# Patient Record
Sex: Female | Born: 1935 | Race: White | Hispanic: No | Marital: Married | Smoking: Never smoker
Health system: Southern US, Community
[De-identification: ages and names within clinical notes are randomized; demographics above are authoritative.]

## PROBLEM LIST (undated history)

## (undated) DIAGNOSIS — I2510 Atherosclerotic heart disease of native coronary artery without angina pectoris: Secondary | ICD-10-CM

## (undated) DIAGNOSIS — I10 Essential (primary) hypertension: Secondary | ICD-10-CM

## (undated) DIAGNOSIS — E785 Hyperlipidemia, unspecified: Secondary | ICD-10-CM

## (undated) DIAGNOSIS — F0390 Unspecified dementia without behavioral disturbance: Secondary | ICD-10-CM

## (undated) HISTORY — DX: Atherosclerotic heart disease of native coronary artery without angina pectoris: I25.10

## (undated) HISTORY — DX: Essential (primary) hypertension: I10

## (undated) HISTORY — DX: Unspecified dementia, unspecified severity, without behavioral disturbance, psychotic disturbance, mood disturbance, and anxiety: F03.90

## (undated) HISTORY — DX: Hyperlipidemia, unspecified: E78.5

## (undated) HISTORY — PX: CORONARY ARTERY BYPASS GRAFT: SHX141

---

## 1998-08-03 DIAGNOSIS — I2510 Atherosclerotic heart disease of native coronary artery without angina pectoris: Secondary | ICD-10-CM

## 1998-08-03 HISTORY — PX: CORONARY ARTERY BYPASS GRAFT: SHX141

## 1998-08-03 HISTORY — DX: Atherosclerotic heart disease of native coronary artery without angina pectoris: I25.10

## 2001-01-15 ENCOUNTER — Encounter: Payer: Self-pay | Admitting: Emergency Medicine

## 2001-01-15 ENCOUNTER — Encounter: Payer: Self-pay | Admitting: Specialist

## 2001-01-15 ENCOUNTER — Inpatient Hospital Stay (HOSPITAL_COMMUNITY): Admission: EM | Admit: 2001-01-15 | Discharge: 2001-01-19 | Payer: Self-pay | Admitting: Emergency Medicine

## 2001-01-19 ENCOUNTER — Inpatient Hospital Stay (HOSPITAL_COMMUNITY)
Admission: RE | Admit: 2001-01-19 | Discharge: 2001-01-26 | Payer: Self-pay | Admitting: Physical Medicine & Rehabilitation

## 2002-11-07 ENCOUNTER — Ambulatory Visit (HOSPITAL_COMMUNITY): Admission: RE | Admit: 2002-11-07 | Discharge: 2002-11-07 | Payer: Self-pay | Admitting: *Deleted

## 2002-11-07 ENCOUNTER — Encounter: Payer: Self-pay | Admitting: Family Medicine

## 2006-02-23 ENCOUNTER — Ambulatory Visit (HOSPITAL_COMMUNITY): Admission: RE | Admit: 2006-02-23 | Discharge: 2006-02-24 | Payer: Self-pay | Admitting: Ophthalmology

## 2007-11-28 IMAGING — CR DG CHEST 2V
2 series · 2 of 2 positions shown · non-contrast
Comparison: none

CLINICAL DATA: 78-year-old female, preretinal fibrosis, preop for OR today.
 CHEST ? 2 VIEW:

[view not recorded (1 of 2)]
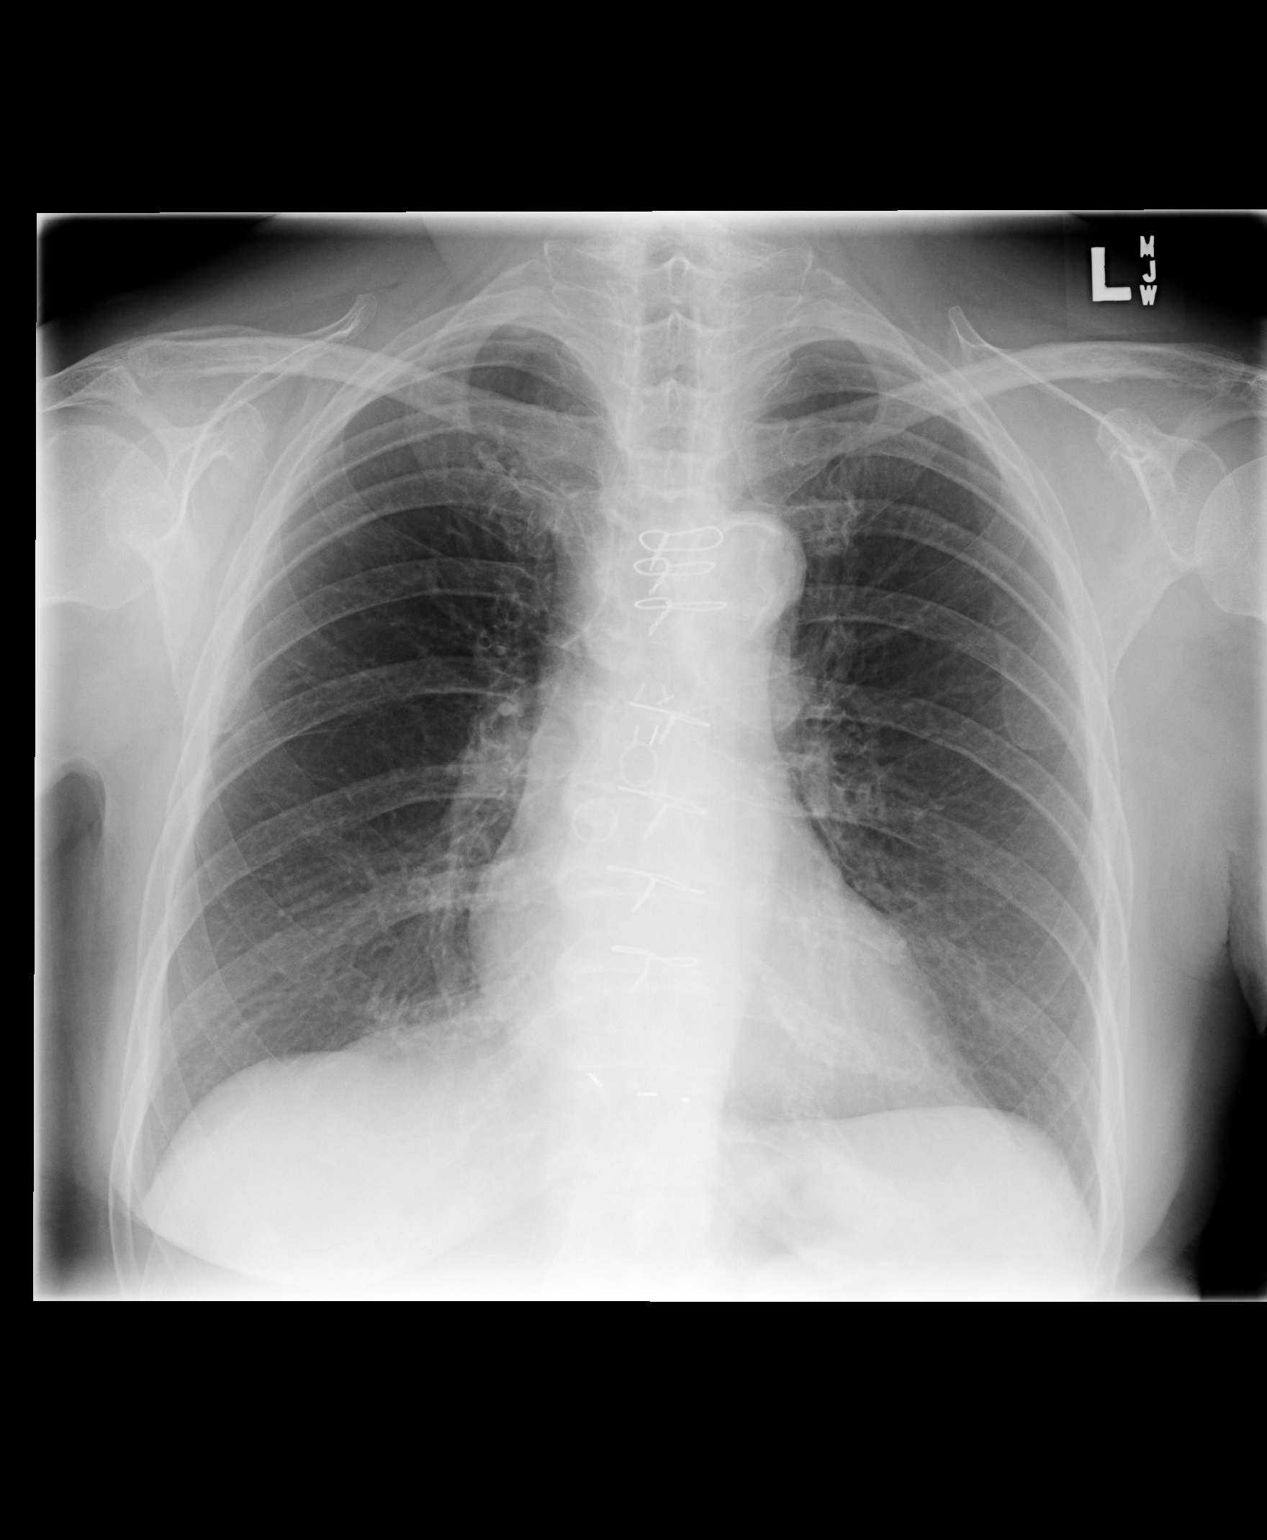

[view not recorded (2 of 2)]
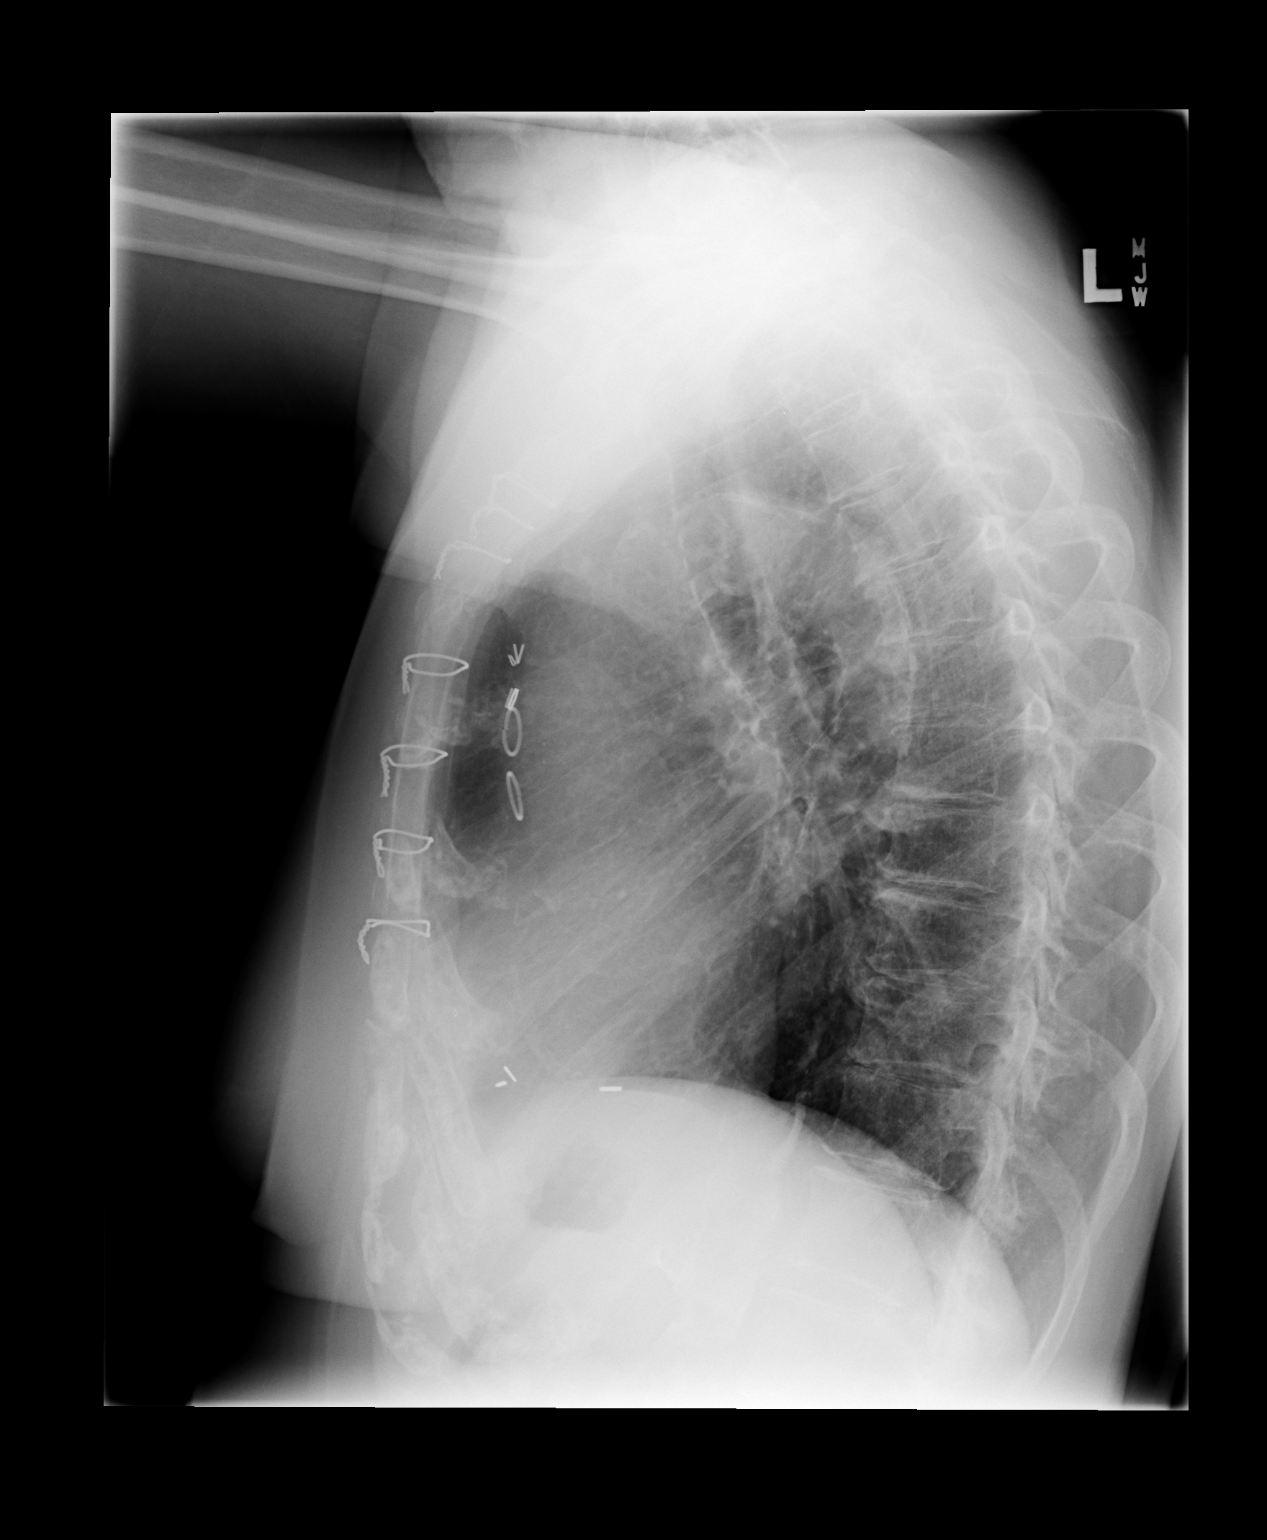

[2 of 2 positions shown; findings below may reference images not displayed]

FINDINGS: Cardiac/pericardiac silhouette is within normal limits for size.  There is some mild prominence of the interstitium, likely chronic in nature.  The patient is status post median sternotomy for CABG.  Mild degenerative changes are noted in the thoracic spine.
IMPRESSION: 1. No acute cardiopulmonary disease.
 2. Mild interstitium prominence, likely chronic in nature.
 3. Atherosclerosis.  
 4. Status post CABG.

## 2010-10-15 ENCOUNTER — Ambulatory Visit: Payer: Self-pay | Admitting: Cardiology

## 2010-11-17 ENCOUNTER — Ambulatory Visit: Payer: Self-pay | Admitting: Cardiology

## 2010-11-27 ENCOUNTER — Encounter: Payer: Self-pay | Admitting: Cardiology

## 2010-11-27 DIAGNOSIS — I2510 Atherosclerotic heart disease of native coronary artery without angina pectoris: Secondary | ICD-10-CM | POA: Insufficient documentation

## 2010-11-27 DIAGNOSIS — I10 Essential (primary) hypertension: Secondary | ICD-10-CM | POA: Insufficient documentation

## 2010-11-27 DIAGNOSIS — F0390 Unspecified dementia without behavioral disturbance: Secondary | ICD-10-CM | POA: Insufficient documentation

## 2010-11-27 DIAGNOSIS — E785 Hyperlipidemia, unspecified: Secondary | ICD-10-CM | POA: Insufficient documentation

## 2010-12-03 ENCOUNTER — Ambulatory Visit (INDEPENDENT_AMBULATORY_CARE_PROVIDER_SITE_OTHER): Payer: Medicare Other | Admitting: Cardiology

## 2010-12-03 ENCOUNTER — Encounter: Payer: Self-pay | Admitting: Cardiology

## 2010-12-03 VITALS — BP 148/62 | HR 70 | Ht 62.0 in | Wt 121.2 lb

## 2010-12-03 DIAGNOSIS — I2510 Atherosclerotic heart disease of native coronary artery without angina pectoris: Secondary | ICD-10-CM

## 2010-12-03 DIAGNOSIS — E785 Hyperlipidemia, unspecified: Secondary | ICD-10-CM

## 2010-12-03 DIAGNOSIS — I10 Essential (primary) hypertension: Secondary | ICD-10-CM

## 2010-12-03 NOTE — Patient Instructions (Signed)
Continue your current medication.  Have your follow up blood work with Dr. Mikey Bussing in June.  I will see you for follow up in 6 months.

## 2010-12-03 NOTE — Assessment & Plan Note (Signed)
She has good blood pressure control we will continue with her current therapy.

## 2010-12-03 NOTE — Assessment & Plan Note (Signed)
Marilyn Torres is asymptomatic. Given her advanced age and dementia I would recommend conservative therapy.

## 2010-12-03 NOTE — Progress Notes (Signed)
Marilyn Torres Date of Birth: Oct 16, 1927   History of Present Illness: Marilyn Torres is seen today for followup. She states she's done for a well over the past year. She's had no significant cardiac symptoms including chest pain, shortness of breath, or palpitations. Her blood pressure readings at home have been well controlled. Her blood sugars are averaging 95 in the morning and 155 in the evening. She does have some mild edema.  Current Outpatient Prescriptions on File Prior to Visit  Medication Sig Dispense Refill  . amLODipine (NORVASC) 5 MG tablet Take 5 mg by mouth daily.        Marland Kitchen aspirin 81 MG tablet Take 81 mg by mouth daily.        . calcium carbonate (OS-CAL) 600 MG TABS Take 600 mg by mouth 2 (two) times daily with a meal.        . Cholecalciferol (VITAMIN D) 1000 UNITS capsule Take 1,000 Units by mouth daily.        . ferrous gluconate (FERGON) 246 (28 FE) MG tablet Take by mouth daily with breakfast.        . folic acid (FOLVITE) 1 MG tablet Take 1 mg by mouth daily.        . furosemide (LASIX) 40 MG tablet Take 40 mg by mouth daily.        Marland Kitchen glimepiride (AMARYL) 1 MG tablet Take 1 mg by mouth daily before breakfast.        . levothyroxine (SYNTHROID, LEVOTHROID) 75 MCG tablet Take 37.5 mcg by mouth daily.        Marland Kitchen lisinopril (PRINIVIL,ZESTRIL) 40 MG tablet Take 40 mg by mouth daily.        . metFORMIN (GLUMETZA) 1000 MG (MOD) 24 hr tablet Take 1,000 mg by mouth 2 (two) times daily with a meal.        . metoprolol (TOPROL-XL) 50 MG 24 hr tablet Take 50 mg by mouth daily.        . Multiple Vitamin (MULTIVITAMIN) tablet Take 1 tablet by mouth daily.        . pioglitazone (ACTOS) 30 MG tablet Take 30 mg by mouth daily.        . pravastatin (PRAVACHOL) 40 MG tablet Take 40 mg by mouth daily.        . raloxifene (EVISTA) 60 MG tablet Take 60 mg by mouth daily.        . rivastigmine (EXELON) 4.6 mg/24hr Place 1 patch onto the skin daily.        . vitamin E 400 UNIT capsule Take  400 Units by mouth daily.          Allergies  Allergen Reactions  . Lopid (Gemfibrozil)   . Penicillins     Past Medical History  Diagnosis Date  . Hypertension   . Hyperlipidemia   . Diabetes mellitus     TYPE 2  . Dementia   . Coronary artery disease 2000    Past Surgical History  Procedure Date  . Coronary artery bypass graft 2000    History  Smoking status  . Never Smoker   Smokeless tobacco  . Never Used    History  Alcohol Use No    Family History  Problem Relation Age of Onset  . Heart attack Father   . Anemia Sister   . Heart attack Brother     Review of Systems: The review of systems is positive for memory loss.  She is sedentary.All  other systems were reviewed and are negative.  Physical Exam: BP 148/62  Pulse 70  Ht 5\' 2"  (1.575 m)  Wt 121 lb 4 oz (54.999 kg)  BMI 22.18 kg/m2 She is an elderly white female in no acute distress. Her short-term memory is very poor. HEENT exam is unremarkable. She has no JVD, adenopathy, or bruits. Lungs are clear. Cardiac exam reveals a regular rate and rhythm without gallop, murmur, or click. Abdomen is soft and nontender without masses or bruits. Extremities are without edema. LABORATORY DATA: ECG demonstrates normal sinus rhythm with a normal ECG.  Assessment / Plan:

## 2010-12-03 NOTE — Assessment & Plan Note (Signed)
We will continue on her pravastatin. She is scheduled for lab work with her primary physician in June.

## 2011-03-23 ENCOUNTER — Ambulatory Visit (INDEPENDENT_AMBULATORY_CARE_PROVIDER_SITE_OTHER): Payer: Medicare Other | Admitting: Ophthalmology

## 2011-05-25 ENCOUNTER — Ambulatory Visit (INDEPENDENT_AMBULATORY_CARE_PROVIDER_SITE_OTHER): Payer: Medicare Other | Admitting: Cardiology

## 2011-05-25 ENCOUNTER — Encounter: Payer: Self-pay | Admitting: Cardiology

## 2011-05-25 VITALS — BP 120/60 | HR 66 | Ht 62.0 in | Wt 126.8 lb

## 2011-05-25 DIAGNOSIS — I2510 Atherosclerotic heart disease of native coronary artery without angina pectoris: Secondary | ICD-10-CM

## 2011-05-25 DIAGNOSIS — E119 Type 2 diabetes mellitus without complications: Secondary | ICD-10-CM

## 2011-05-25 DIAGNOSIS — R609 Edema, unspecified: Secondary | ICD-10-CM

## 2011-05-25 DIAGNOSIS — I10 Essential (primary) hypertension: Secondary | ICD-10-CM

## 2011-05-25 NOTE — Assessment & Plan Note (Signed)
Blood pressure in fact is well controlled despite recent decrease in her medication. I think she does have more edema now have recommended that he increase her Lasix back to 40 mg per day. Continue on the half doses of Norvasc and lisinopril. If her blood pressure continues to round low I would recommend stopping her Norvasc since this may contribute to her edema. We will followup again in 6 months.

## 2011-05-25 NOTE — Progress Notes (Signed)
Marilyn Torres Date of Birth: Jan 20, 1928   History of Present Illness: Marilyn Torres is seen today for followup. She has a history of coronary disease and is status post CABG in 2000. Her last stress test in 2008 was normal. She has dementia. Her history is predominantly provided by her husband. He reports that he had reduced her Norvasc, Lasix, and lisinopril by half because he was concerned about low blood pressure. She has had no significant chest pain or shortness of breath. She denies any dizziness or syncope.  Current Outpatient Prescriptions on File Prior to Visit  Medication Sig Dispense Refill  . amLODipine (NORVASC) 5 MG tablet Take 5 mg by mouth daily.       Marland Kitchen aspirin 81 MG tablet Take 81 mg by mouth daily.        . Cholecalciferol (VITAMIN D) 1000 UNITS capsule Take 1,000 Units by mouth daily.        . ferrous gluconate (FERGON) 246 (28 FE) MG tablet Take by mouth daily with breakfast.        . folic acid (FOLVITE) 1 MG tablet Take 1 mg by mouth daily.        . furosemide (LASIX) 40 MG tablet Take 40 mg by mouth daily.        Marland Kitchen glimepiride (AMARYL) 1 MG tablet Take 1 mg by mouth daily before breakfast.        . levothyroxine (SYNTHROID, LEVOTHROID) 75 MCG tablet Take 37.5 mcg by mouth daily.        Marland Kitchen lisinopril (PRINIVIL,ZESTRIL) 40 MG tablet Take 20 mg by mouth daily.       . metFORMIN (GLUMETZA) 1000 MG (MOD) 24 hr tablet Take 1,000 mg by mouth 2 (two) times daily with a meal.        . metoprolol (TOPROL-XL) 50 MG 24 hr tablet Take 50 mg by mouth daily.        . Multiple Vitamin (MULTIVITAMIN) tablet Take 1 tablet by mouth daily.        . pioglitazone (ACTOS) 30 MG tablet Take 45 mg by mouth daily.       . pravastatin (PRAVACHOL) 40 MG tablet Take 40 mg by mouth daily.        . vitamin E 400 UNIT capsule Take 400 Units by mouth daily.          Allergies  Allergen Reactions  . Lopid (Gemfibrozil)   . Penicillins     Past Medical History  Diagnosis Date  .  Hypertension   . Hyperlipidemia   . Diabetes mellitus     TYPE 2  . Dementia   . Coronary artery disease 2000    Past Surgical History  Procedure Date  . Coronary artery bypass graft 2000    History  Smoking status  . Never Smoker   Smokeless tobacco  . Never Used    History  Alcohol Use No    Family History  Problem Relation Age of Onset  . Heart attack Father   . Anemia Sister   . Heart attack Brother     Review of Systems: The review of systems is positive for memory loss.  She is sedentary.All other systems were reviewed and are negative.  Physical Exam: BP 120/60  Pulse 66  Ht 5\' 2"  (1.575 m)  Wt 126 lb 12.8 oz (57.516 kg)  BMI 23.19 kg/m2 She is an elderly white female in no acute distress. Her short-term memory is very poor. HEENT exam  is unremarkable. She has no JVD, adenopathy, or bruits. Lungs are clear. Cardiac exam reveals a regular rate and rhythm without gallop, murmur, or click. Abdomen is soft and nontender without masses or bruits. Extremities reveal 2+ edema. LABORATORY DATA: ECG demonstrates normal sinus rhythm with a normal ECG.  Assessment / Plan:

## 2011-05-25 NOTE — Assessment & Plan Note (Signed)
She is having no significant anginal symptoms. We will continue conservative treatment. Will monitor for any new symptoms.

## 2011-05-25 NOTE — Patient Instructions (Signed)
Increase furosemide to 40 mg daily.  Continue the other medications as you are doing.  I will see you again in 6 months.

## 2013-01-31 ENCOUNTER — Encounter: Payer: Self-pay | Admitting: Cardiology

## 2013-06-16 ENCOUNTER — Encounter: Payer: Self-pay | Admitting: Cardiology

## 2013-06-16 ENCOUNTER — Ambulatory Visit (INDEPENDENT_AMBULATORY_CARE_PROVIDER_SITE_OTHER): Payer: Medicare Other | Admitting: Cardiology

## 2013-06-16 VITALS — BP 161/62 | HR 76 | Ht 62.0 in | Wt 123.4 lb

## 2013-06-16 DIAGNOSIS — E785 Hyperlipidemia, unspecified: Secondary | ICD-10-CM

## 2013-06-16 DIAGNOSIS — I2510 Atherosclerotic heart disease of native coronary artery without angina pectoris: Secondary | ICD-10-CM

## 2013-06-16 DIAGNOSIS — I10 Essential (primary) hypertension: Secondary | ICD-10-CM

## 2013-06-16 NOTE — Progress Notes (Signed)
Marilyn Torres Date of Birth: 21-Oct-1927   History of Present Illness: Marilyn Torres is seen today for followup. She has a history of coronary disease and is status post CABG in 2000. Her last stress test in 2008 was normal. She has severe dementia. Her history is predominantly provided by her husband. He reports that her amlodipine was discontinued this past summer because of edema. The swelling resolved. He has been monitoring her blood pressure and gets an average reading of 132/60. She has had no significant chest pain or shortness of breath. She denies any dizziness or syncope.  Current Outpatient Prescriptions on File Prior to Visit  Medication Sig Dispense Refill  . aspirin 81 MG tablet Take 81 mg by mouth daily.        . Cholecalciferol (VITAMIN D) 1000 UNITS capsule Take 1,000 Units by mouth daily.        Marland Kitchen donepezil (ARICEPT) 10 MG tablet Take 10 mg by mouth daily.        . ferrous gluconate (FERGON) 246 (28 FE) MG tablet Take by mouth daily with breakfast.        . folic acid (FOLVITE) 1 MG tablet Take 1 mg by mouth daily.        . furosemide (LASIX) 40 MG tablet Take 40 mg by mouth daily.        Marland Kitchen glimepiride (AMARYL) 1 MG tablet Take 1 mg by mouth daily before breakfast.        . levothyroxine (SYNTHROID, LEVOTHROID) 75 MCG tablet Take 37.5 mcg by mouth daily.        Marland Kitchen lisinopril (PRINIVIL,ZESTRIL) 40 MG tablet Take 20 mg by mouth daily.       . metFORMIN (GLUMETZA) 1000 MG (MOD) 24 hr tablet Take 1,000 mg by mouth 2 (two) times daily with a meal.        . metoprolol (TOPROL-XL) 50 MG 24 hr tablet Take 50 mg by mouth daily.        . Multiple Vitamin (MULTIVITAMIN) tablet Take 1 tablet by mouth daily.        . pioglitazone (ACTOS) 30 MG tablet Take 45 mg by mouth daily.       . pravastatin (PRAVACHOL) 40 MG tablet Take 40 mg by mouth daily.        . vitamin E 400 UNIT capsule Take 400 Units by mouth daily.         No current facility-administered medications on file prior to  visit.    Allergies  Allergen Reactions  . Lopid [Gemfibrozil]   . Penicillins     Past Medical History  Diagnosis Date  . Hypertension   . Hyperlipidemia   . Diabetes mellitus     TYPE 2  . Dementia   . Coronary artery disease 2000    Past Surgical History  Procedure Laterality Date  . Coronary artery bypass graft  2000    History  Smoking status  . Never Smoker   Smokeless tobacco  . Never Used    History  Alcohol Use No    Family History  Problem Relation Age of Onset  . Heart attack Father   . Anemia Sister   . Heart attack Brother     Review of Systems: The review of systems is positive for memory loss.  She is sedentary.All other systems were reviewed and are negative.  Physical Exam: BP 161/62  Pulse 76  Ht 5\' 2"  (1.575 m)  Wt 123 lb 6.4 oz (  55.974 kg)  BMI 22.56 kg/m2 She is an elderly white female in no acute distress. Her short-term memory is very poor. HEENT exam is unremarkable. She has no JVD, adenopathy, or bruits. Lungs are clear. Cardiac exam reveals a regular rate and rhythm without gallop, murmur, or click. Abdomen is soft and nontender without masses or bruits. Extremities reveal 2+ edema.  LABORATORY DATA: ECG demonstrates normal sinus rhythm with a normal ECG.  Assessment / Plan: 1. Coronary disease status post CABG in 2000. Asymptomatic. With her advanced dementia she is not a candidate for intervention. We will continue medical therapy.  2. Hypertension. Blood pressure is elevated today but husband has been getting consistently good readings at home. We will continue therapy with metoprolol and lisinopril.

## 2013-06-16 NOTE — Patient Instructions (Signed)
Continue your current medication  I will see you in one year.   

## 2014-06-22 ENCOUNTER — Encounter: Payer: Self-pay | Admitting: Cardiology

## 2014-06-22 ENCOUNTER — Ambulatory Visit (INDEPENDENT_AMBULATORY_CARE_PROVIDER_SITE_OTHER): Payer: Medicare Other | Admitting: Cardiology

## 2014-06-22 VITALS — BP 124/82 | HR 73 | Ht <= 58 in | Wt 105.8 lb

## 2014-06-22 DIAGNOSIS — I10 Essential (primary) hypertension: Secondary | ICD-10-CM

## 2014-06-22 DIAGNOSIS — F0390 Unspecified dementia without behavioral disturbance: Secondary | ICD-10-CM

## 2014-06-22 DIAGNOSIS — E785 Hyperlipidemia, unspecified: Secondary | ICD-10-CM

## 2014-06-22 DIAGNOSIS — I25709 Atherosclerosis of coronary artery bypass graft(s), unspecified, with unspecified angina pectoris: Secondary | ICD-10-CM

## 2014-06-22 MED ORDER — NITROGLYCERIN 0.4 MG SL SUBL: 0.4000 mg | SUBLINGUAL_TABLET | SUBLINGUAL | Status: AC | PRN

## 2014-06-22 NOTE — Patient Instructions (Signed)
Continue your current therapy  I will follow up in one year or as needed.

## 2014-06-22 NOTE — Progress Notes (Signed)
Marilyn FerrariBetty R Torres Date of Birth: 07/20/1928   History of Present Illness: Marilyn Torres is seen today for followup of CAD. She has a history of coronary disease and is status post CABG in 2000. Her last stress test in 2008 was normal. She has severe dementia. [place] [facility] [place] [place] Her granddaughter has POA now. Patient has had no significant chest pain or shortness of breath. She denies any dizziness or syncope. Family reports that she is slowing down considerably. Her appetite is poor and she has lost 18 lbs this year. BP has been controlled. She complains of occasional back pain.  Current Outpatient Prescriptions on File Prior to Visit  Medication Sig Dispense Refill  . aspirin 81 MG tablet Take 81 mg by mouth daily.      . Cholecalciferol (VITAMIN D) 1000 UNITS capsule Take 2,000 Units by mouth daily.     Marland Kitchen. donepezil (ARICEPT) 10 MG tablet Take 10 mg by mouth daily.      . ferrous gluconate (FERGON) 246 (28 FE) MG tablet Take 325 mg by mouth daily with breakfast. Take 1/2 tablet    . folic acid (FOLVITE) 1 MG tablet Take 800 mcg by mouth daily.     . furosemide (LASIX) 40 MG tablet Take 40 mg by mouth daily.      Marland Kitchen. glimepiride (AMARYL) 1 MG tablet Take 1 mg by mouth daily before breakfast.      . levothyroxine (SYNTHROID, LEVOTHROID) 75 MCG tablet Take 37.5 mcg by mouth daily.      Marland Kitchen. lisinopril (PRINIVIL,ZESTRIL) 40 MG tablet Take 20 mg by mouth daily.     . metFORMIN (GLUMETZA) 1000 MG (MOD) 24 hr tablet Take 500 mg by mouth 2 (two) times daily with a meal.     . metoprolol (TOPROL-XL) 50 MG 24 hr tablet Take 50 mg by mouth daily.      . Multiple Vitamin (MULTIVITAMIN) tablet Take 1 tablet by mouth daily.      . pioglitazone (ACTOS) 30 MG tablet Take 45 mg by mouth daily. Take 1/2 tablet daily    . pravastatin (PRAVACHOL) 40 MG tablet Take 40 mg by mouth daily.      . vitamin E 400 UNIT capsule Take 400 Units by mouth daily.       No current  facility-administered medications on file prior to visit.    Allergies  Allergen Reactions  . Lopid [Gemfibrozil]   . Penicillins     Past Medical History  Diagnosis Date  . Hypertension   . Hyperlipidemia   . Diabetes mellitus     TYPE 2  . Dementia   . Coronary artery disease 2000    Past Surgical History  Procedure Laterality Date  . Coronary artery bypass graft  2000    History  Smoking status  . Never Smoker   Smokeless tobacco  . Never Used    History  Alcohol Use No    Family History  Problem Relation Age of Onset  . Heart attack Father   . Anemia Sister   . Heart attack Brother     Review of Systems: The review of systems is positive for memory loss.  She is sedentary.All other systems were reviewed and are negative.  Physical Exam: BP 124/82 mmHg  Pulse 73  Ht 4\' 10"  (1.473 m)  Wt 105 lb 12.8 oz (47.991 kg)  BMI 22.12 kg/m2 She is an elderly white female in no acute distress.  Her short-term memory is very poor. HEENT exam is unremarkable. She has no JVD, adenopathy, or bruits. Lungs are clear. Cardiac exam reveals a regular rate and rhythm without gallop, murmur, or click. Abdomen is soft and nontender without masses or bruits. Extremities reveal no edema.  LABORATORY DATA: ECG today demonstrates normal sinus rhythm with LAD, otherwise normal. I have personally reviewed and interpreted this study.   Assessment / Plan: 1. Coronary disease status post CABG in 2000. Asymptomatic. With her advanced dementia she is not a candidate for intervention. We will continue medical therapy.  2. Hypertension. Blood pressure is well controlled.  3. Advanced dementia. Now living with granddaughter who has POA. Husband present today as well. Family expresses that patient be a DNR.

## 2018-03-03 DEATH — deceased
